# Patient Record
Sex: Male | Born: 1975 | Hispanic: No | Marital: Married | Smoking: Former smoker
Health system: Southern US, Community
[De-identification: ages and names within clinical notes are randomized; demographics above are authoritative.]

## PROBLEM LIST (undated history)

## (undated) DIAGNOSIS — I10 Essential (primary) hypertension: Secondary | ICD-10-CM

## (undated) HISTORY — PX: SPLENECTOMY: SUR1306

## (undated) HISTORY — PX: HERNIA REPAIR: SHX51

## (undated) HISTORY — PX: OTHER SURGICAL HISTORY: SHX169

## (undated) HISTORY — PX: ABDOMINAL SURGERY: SHX537

---

## 2011-08-16 ENCOUNTER — Emergency Department (HOSPITAL_COMMUNITY): Payer: Self-pay

## 2011-08-16 ENCOUNTER — Emergency Department (HOSPITAL_COMMUNITY)
Admission: EM | Admit: 2011-08-16 | Discharge: 2011-08-16 | Disposition: A | Discharge status: Home / Self Care | Payer: Self-pay | Attending: Emergency Medicine | Admitting: Emergency Medicine

## 2011-08-16 ENCOUNTER — Encounter (HOSPITAL_COMMUNITY): Payer: Self-pay | Admitting: *Deleted

## 2011-08-16 DIAGNOSIS — S6990XA Unspecified injury of unspecified wrist, hand and finger(s), initial encounter: Secondary | ICD-10-CM | POA: Insufficient documentation

## 2011-08-16 DIAGNOSIS — I10 Essential (primary) hypertension: Secondary | ICD-10-CM | POA: Insufficient documentation

## 2011-08-16 DIAGNOSIS — S6980XA Other specified injuries of unspecified wrist, hand and finger(s), initial encounter: Secondary | ICD-10-CM | POA: Insufficient documentation

## 2011-08-16 DIAGNOSIS — M79609 Pain in unspecified limb: Secondary | ICD-10-CM | POA: Insufficient documentation

## 2011-08-16 DIAGNOSIS — W278XXA Contact with other nonpowered hand tool, initial encounter: Secondary | ICD-10-CM | POA: Insufficient documentation

## 2011-08-16 HISTORY — DX: Essential (primary) hypertension: I10

## 2011-08-16 MED ORDER — CEPHALEXIN 500 MG PO CAPS
500.0000 mg | ORAL_CAPSULE | Freq: Once | ORAL | Status: AC
  Administered 2011-08-16: 500 mg via ORAL
  Filled 2011-08-16: qty 1

## 2011-08-16 MED ORDER — CEPHALEXIN 500 MG PO CAPS: 500.0000 mg | ORAL_CAPSULE | Freq: Four times a day (QID) | ORAL | Status: AC

## 2011-08-16 MED ORDER — HYDROCODONE-ACETAMINOPHEN 5-325 MG PO TABS: 1.0000 | ORAL_TABLET | Freq: Four times a day (QID) | ORAL | Status: AC | PRN

## 2011-08-16 MED ORDER — BUPIVACAINE HCL (PF) 0.5 % IJ SOLN
INTRAMUSCULAR | Status: AC
  Administered 2011-08-16: 150 mg
  Filled 2011-08-16: qty 30

## 2011-08-16 MED ORDER — TETANUS-DIPHTH-ACELL PERTUSSIS 5-2.5-18.5 LF-MCG/0.5 IM SUSP
0.5000 mL | Freq: Once | INTRAMUSCULAR | Status: AC
  Administered 2011-08-16: 0.5 mL via INTRAMUSCULAR
  Filled 2011-08-16: qty 0.5

## 2011-08-16 MED ORDER — OXYCODONE-ACETAMINOPHEN 5-325 MG PO TABS
1.0000 | ORAL_TABLET | Freq: Once | ORAL | Status: AC
  Administered 2011-08-16: 1 via ORAL
  Filled 2011-08-16: qty 1

## 2011-08-16 NOTE — ED Notes (Signed)
Dr. Estell Harpin stated he spoke with hand surgeon and the hand surgeon stated to have patient call him tomorrow morning to schedule follow up appointment. Dr. Estell Harpin also stated hand surgeon's recommendations were to clean hand and finger up and to dress it. Dr. Estell Harpin irrigated laceration with normal saline then instructed me to clean rest of hand and wrap up finger. Hand and finger cleansed with mild soap, water, and guaze. Non-adherent dressing applied then guaze applied afterwards, wrapped with nonadherent dressing. Finger still mildly bleeding and soaking through guaze. Dr. Estell Harpin notified. Patient also stated that he had no ride or way to get to [facility] for follow up tomorrow and stated that he does not drive. Dr. Estell Harpin notified of this as well.
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
## 2011-08-16 NOTE — ED Notes (Signed)
States a logging hook sliced the end of his finger

## 2011-08-16 NOTE — ED Notes (Signed)
Lac to lt middle finger when cutting wood

## 2011-08-16 NOTE — ED Notes (Signed)
Tip of patient's left middle finger appears to be more discolored (grey) compared to the initial appearance. Dr. Estell Harpin notified and at bedside assessing finger. Stated he was going to speak with hand surgeon first before suturing finger.

## 2011-08-16 NOTE — ED Notes (Signed)
Patient very concerned about leaving with his finger still bleeding and not sutured, states he will go and get it seen at [facility].  Pt was upset about pain medication prescribed to him as well.

## 2019-11-16 ENCOUNTER — Emergency Department (HOSPITAL_COMMUNITY)
Admission: EM | Admit: 2019-11-16 | Discharge: 2019-11-17 | Disposition: A | Discharge status: Home / Self Care | Payer: Self-pay | Attending: Emergency Medicine | Admitting: Emergency Medicine

## 2019-11-16 ENCOUNTER — Encounter (HOSPITAL_COMMUNITY): Payer: Self-pay | Admitting: Emergency Medicine

## 2019-11-16 ENCOUNTER — Other Ambulatory Visit: Payer: Self-pay

## 2019-11-16 DIAGNOSIS — Y93F2 Activity, caregiving, lifting: Secondary | ICD-10-CM | POA: Insufficient documentation

## 2019-11-16 DIAGNOSIS — Z87891 Personal history of nicotine dependence: Secondary | ICD-10-CM | POA: Insufficient documentation

## 2019-11-16 DIAGNOSIS — Z79899 Other long term (current) drug therapy: Secondary | ICD-10-CM | POA: Insufficient documentation

## 2019-11-16 DIAGNOSIS — Y9289 Other specified places as the place of occurrence of the external cause: Secondary | ICD-10-CM | POA: Insufficient documentation

## 2019-11-16 DIAGNOSIS — X500XXA Overexertion from strenuous movement or load, initial encounter: Secondary | ICD-10-CM | POA: Insufficient documentation

## 2019-11-16 DIAGNOSIS — S39012A Strain of muscle, fascia and tendon of lower back, initial encounter: Secondary | ICD-10-CM | POA: Insufficient documentation

## 2019-11-16 DIAGNOSIS — I10 Essential (primary) hypertension: Secondary | ICD-10-CM | POA: Insufficient documentation

## 2019-11-16 MED ORDER — IBUPROFEN 800 MG PO TABS
800.0000 mg | ORAL_TABLET | Freq: Once | ORAL | Status: AC
  Administered 2019-11-16: 800 mg via ORAL
  Filled 2019-11-16: qty 1

## 2019-11-16 NOTE — ED Triage Notes (Signed)
Pt c/o back pain x 2 days with shooting pains down his right leg.

## 2019-11-17 MED ORDER — AMLODIPINE BESYLATE 10 MG PO TABS: 10.0000 mg | ORAL_TABLET | Freq: Every day | ORAL | 0 refills | Status: AC

## 2019-11-17 MED ORDER — NAPROXEN 500 MG PO TABS: 500.0000 mg | ORAL_TABLET | Freq: Two times a day (BID) | ORAL | 0 refills | Status: AC

## 2019-11-17 MED ORDER — TRAMADOL HCL 50 MG PO TABS: 50.0000 mg | ORAL_TABLET | Freq: Four times a day (QID) | ORAL | 0 refills | Status: DC | PRN

## 2019-11-17 MED ORDER — HYDROCODONE-ACETAMINOPHEN 5-325 MG PO TABS: 1.0000 | ORAL_TABLET | ORAL | 0 refills | Status: AC | PRN

## 2019-11-17 MED ORDER — NAPROXEN 250 MG PO TABS
500.0000 mg | ORAL_TABLET | Freq: Once | ORAL | Status: AC
  Administered 2019-11-17: 500 mg via ORAL
  Filled 2019-11-17: qty 2

## 2019-11-17 MED ORDER — CYCLOBENZAPRINE HCL 10 MG PO TABS: 10.0000 mg | ORAL_TABLET | Freq: Three times a day (TID) | ORAL | 0 refills | Status: AC | PRN

## 2019-11-17 MED ORDER — AMLODIPINE BESYLATE 5 MG PO TABS
10.0000 mg | ORAL_TABLET | Freq: Once | ORAL | Status: AC
  Administered 2019-11-17: 10 mg via ORAL
  Filled 2019-11-17: qty 2

## 2019-11-17 MED ORDER — TRAMADOL HCL 50 MG PO TABS: 50.0000 mg | ORAL_TABLET | Freq: Four times a day (QID) | ORAL | 0 refills | Status: AC | PRN

## 2019-11-17 MED ORDER — CYCLOBENZAPRINE HCL 10 MG PO TABS
10.0000 mg | ORAL_TABLET | Freq: Once | ORAL | Status: AC
Start: 2019-11-17 — End: 2019-11-17
  Administered 2019-11-17: 10 mg via ORAL
  Filled 2019-11-17: qty 1

## 2019-11-17 NOTE — Discharge Instructions (Addendum)
Apply ice for 30 minutes at a time, 4 times a day.  You may take acetaminophen along with the naproxen for better pain relief.  Reserve tramadol for severe pain.
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
## 2019-11-19 MED FILL — Hydrocodone-Acetaminophen Tab 5-325 MG: ORAL | Qty: 6 | Status: AC
